# Patient Record
Sex: Female | Born: 1965 | Race: White | Hispanic: No | Marital: Married | Smoking: Never smoker
Health system: Southern US, Community
[De-identification: ages and names within clinical notes are randomized; demographics above are authoritative.]

---

## 1998-12-22 ENCOUNTER — Other Ambulatory Visit: Admission: RE | Admit: 1998-12-22 | Discharge: 1998-12-22 | Payer: Self-pay | Admitting: Obstetrics and Gynecology

## 1999-12-26 ENCOUNTER — Other Ambulatory Visit: Admission: RE | Admit: 1999-12-26 | Discharge: 1999-12-26 | Payer: Self-pay | Admitting: Obstetrics and Gynecology

## 2000-12-24 ENCOUNTER — Other Ambulatory Visit: Admission: RE | Admit: 2000-12-24 | Discharge: 2000-12-24 | Payer: Self-pay | Admitting: Obstetrics and Gynecology

## 2002-01-08 ENCOUNTER — Other Ambulatory Visit: Admission: RE | Admit: 2002-01-08 | Discharge: 2002-01-08 | Payer: Self-pay | Admitting: Obstetrics and Gynecology

## 2003-01-27 ENCOUNTER — Other Ambulatory Visit: Admission: RE | Admit: 2003-01-27 | Discharge: 2003-01-27 | Payer: Self-pay | Admitting: Obstetrics and Gynecology

## 2004-02-16 ENCOUNTER — Other Ambulatory Visit: Admission: RE | Admit: 2004-02-16 | Discharge: 2004-02-16 | Payer: Self-pay | Admitting: Obstetrics and Gynecology

## 2005-02-15 ENCOUNTER — Other Ambulatory Visit: Admission: RE | Admit: 2005-02-15 | Discharge: 2005-02-15 | Payer: Self-pay | Admitting: Obstetrics and Gynecology

## 2006-02-27 ENCOUNTER — Other Ambulatory Visit: Admission: RE | Admit: 2006-02-27 | Discharge: 2006-02-27 | Payer: Self-pay | Admitting: Obstetrics and Gynecology

## 2008-02-27 ENCOUNTER — Encounter: Admission: RE | Admit: 2008-02-27 | Discharge: 2008-02-27 | Payer: Self-pay | Admitting: Obstetrics and Gynecology

## 2008-08-28 ENCOUNTER — Encounter: Admission: RE | Admit: 2008-08-28 | Discharge: 2008-08-28 | Payer: Self-pay | Admitting: Obstetrics and Gynecology

## 2014-02-20 ENCOUNTER — Encounter (HOSPITAL_COMMUNITY): Payer: Self-pay | Admitting: Emergency Medicine

## 2014-02-20 ENCOUNTER — Emergency Department (HOSPITAL_COMMUNITY)
Admission: EM | Admit: 2014-02-20 | Discharge: 2014-02-20 | Disposition: A | Discharge status: Home / Self Care | Payer: BC Managed Care – PPO | Attending: Emergency Medicine | Admitting: Emergency Medicine

## 2014-02-20 DIAGNOSIS — S0083XA Contusion of other part of head, initial encounter: Secondary | ICD-10-CM

## 2014-02-20 DIAGNOSIS — Y92009 Unspecified place in unspecified non-institutional (private) residence as the place of occurrence of the external cause: Secondary | ICD-10-CM | POA: Insufficient documentation

## 2014-02-20 DIAGNOSIS — S0191XA Laceration without foreign body of unspecified part of head, initial encounter: Secondary | ICD-10-CM

## 2014-02-20 DIAGNOSIS — W010XXA Fall on same level from slipping, tripping and stumbling without subsequent striking against object, initial encounter: Secondary | ICD-10-CM | POA: Insufficient documentation

## 2014-02-20 DIAGNOSIS — S0190XA Unspecified open wound of unspecified part of head, initial encounter: Secondary | ICD-10-CM | POA: Insufficient documentation

## 2014-02-20 DIAGNOSIS — Y9301 Activity, walking, marching and hiking: Secondary | ICD-10-CM | POA: Insufficient documentation

## 2014-02-20 DIAGNOSIS — S1093XA Contusion of unspecified part of neck, initial encounter: Secondary | ICD-10-CM

## 2014-02-20 DIAGNOSIS — S0003XA Contusion of scalp, initial encounter: Secondary | ICD-10-CM | POA: Insufficient documentation

## 2014-02-20 MED ORDER — HYDROCODONE-ACETAMINOPHEN 5-325 MG PO TABS: 1.0000 | ORAL_TABLET | ORAL | Status: AC | PRN

## 2014-02-20 NOTE — ED Notes (Signed)
Wound closed by Tiffany, PA with staples. Bleeding controlled

## 2014-02-20 NOTE — ED Notes (Signed)
Pt from home c/o slipping on ice, falling and hitting the back of her head on the side of house. Pt denies LOC, N/V, visual changes, dizziness. Pt also adds that she hit L elbow soreness. Pt denies neck or back pain. Pt is A&O and in NAD

## 2014-02-20 NOTE — Discharge Instructions (Signed)
Head Injury, Adult °You have received a head injury. It does not appear serious at this time. Headaches and vomiting are common following head injury. It should be easy to awaken from sleeping. Sometimes it is necessary for you to stay in the emergency department for a while for observation. Sometimes admission to the hospital may be needed. After injuries such as yours, most problems occur within the first 24 hours, but side effects may occur up to 7 10 days after the injury. It is important for you to carefully monitor your condition and contact your health care provider or seek immediate medical care if there is a change in your condition. °WHAT ARE THE TYPES OF HEAD INJURIES? °Head injuries can be as minor as a bump. Some head injuries can be more severe. More severe head injuries include: °· A jarring injury to the brain (concussion). °· A bruise of the brain (contusion). This mean there is bleeding in the brain that can cause swelling. °· A cracked skull (skull fracture). °· Bleeding in the brain that collects, clots, and forms a bump (hematoma). °WHAT CAUSES A HEAD INJURY? °A serious head injury is most likely to happen to someone who is in a car wreck and is not wearing a seat belt. Other causes of major head injuries include bicycle or motorcycle accidents, sports injuries, and falls. °HOW ARE HEAD INJURIES DIAGNOSED? °A complete history of the event leading to the injury and your current symptoms will be helpful in diagnosing head injuries. Many times, pictures of the brain, such as CT or MRI are needed to see the extent of the injury. Often, an overnight hospital stay is necessary for observation.  °WHEN SHOULD I SEEK IMMEDIATE MEDICAL CARE?  °You should get help right away if: °· You have confusion or drowsiness. °· You feel sick to your stomach (nauseous) or have continued, forceful vomiting. °· You have dizziness or unsteadiness that is getting worse. °· You have severe, continued headaches not  relieved by medicine. Only take over-the-counter or prescription medicines for pain, fever, or discomfort as directed by your health care provider. °· You do not have normal function of the arms or legs or are unable to walk. °· You notice changes in the black spots in the center of the colored part of your eye (pupil). °· You have a clear or bloody fluid coming from your nose or ears. °· You have a loss of vision. °During the next 24 hours after the injury, you must stay with someone who can watch you for the warning signs. This person should contact local emergency services (911 in the [place] if you have seizures, you become unconscious, or you are unable to wake up. °HOW CAN I PREVENT A HEAD INJURY IN THE FUTURE? °The most important factor for preventing major head injuries is avoiding motor vehicle accidents.  To minimize the potential for damage to your head, it is crucial to wear seat belts while riding in motor vehicles. Wearing helmets while bike riding and playing collision sports (like football) is also helpful. Also, avoiding dangerous activities around the house will further help reduce your risk of head injury.  °WHEN CAN I RETURN TO NORMAL ACTIVITIES AND ATHLETICS? °You should be reevaluated by your health care provider before returning to these activities. If you have any of the following symptoms, you should not return to activities or contact sports until 1 week after the symptoms have stopped: °· Persistent headache. °· Dizziness or vertigo. °· Poor attention and concentration. °·   Confusion. °· Memory problems. °· Nausea or vomiting. °· Fatigue or tire easily. °· Irritability. °· Intolerant of bright lights or loud noises. °· Anxiety or depression. °· Disturbed sleep. °MAKE SURE YOU:  °· Understand these instructions. °· Will watch your condition. °· Will get help right away if you are not doing well or get worse. °Document Released: 12/11/2005 Document Revised: 10/01/2013 Document Reviewed:  08/18/2013 °[employer]® Patient Information ©2014 [employer]. ° °Staple Wound Closure °Staples are used to help a wound heal faster by holding the edges of the wound together. °HOME CARE °· Keep the area around the staples clean and dry. °· Rest and raise (elevate) the injured part above the level of your heart. °· See your doctor for a follow-up check of the wound. °· See your doctor to have the staples removed. °· Clean the wound daily with water. °· Do not soak the wound in water for long periods of time. °· Let air reach the wound as it heals. °GET HELP RIGHT AWAY IF:  °· You have redness or puffiness around the wound. °· You have a red line going away from the wound. °· You have more pain or tenderness. °· You have yellowish-white fluid (pus) coming from the wound. °· Your wound does not stay together after the staples have been taken out. °· You see something coming out of the wound, such as wood or glass. °· You have problems moving the injured area. °· You have a fever or lasting symptoms for more than 2-3 days. °· You have a fever and your symptoms suddenly get worse. °MAKE SURE YOU:  °· Understand these instructions. °· Will watch this condition. °· Will get help right away if you are not doing well or get worse. °Document Released: 09/19/2008 Document Revised: 09/04/2012 Document Reviewed: 06/23/2012 °[employer]® Patient Information ©2014 [employer]. ° °

## 2014-02-20 NOTE — ED Provider Notes (Signed)
CSN: [id number]     Arrival date & time 02/20/14  4098 History   First MD Initiated Contact with Patient 02/20/14 0848     Chief Complaint  Patient presents with  . Fall  . Head Laceration     (Consider location/radiation/quality/duration/timing/severity/associated sxs/prior Treatment) HPI  Patient to the ER with complaints of fall and head lacerations. She was walking in her driveway when she slipped and hit head on the edge of her house. She reports being stunned right away but then got up, went into the house and asked her daughter to bring her to the hospital. She reports not feeling dizzy, nauseous, no headache, no confusion. States she feels fine other than the cut that hurts. She denies neck pain. Denies loc. Large head laceration to posterior scalp. She reports being UTD on tetanus  History reviewed. No pertinent past medical history. History reviewed. No pertinent past surgical history. No family history on file. History  Substance Use Topics  . Smoking status: Never Smoker   . Smokeless tobacco: Not on file  . Alcohol Use: No   OB History   Grav Para Term Preterm Abortions TAB SAB Ect Mult Living                 Review of Systems  The patient denies anorexia, fever, weight loss,, vision loss, decreased hearing, hoarseness, chest pain, syncope, dyspnea on exertion, peripheral edema, balance deficits, hemoptysis, abdominal pain, melena, hematochezia, severe indigestion/heartburn, hematuria, incontinence, genital sores, muscle weakness, suspicious skin lesions, transient blindness, difficulty walking, depression, unusual weight change,, enlarged lymph nodes, angioedema, and breast masses.   Allergies  Review of patient's allergies indicates not on file.  Home Medications   Current Outpatient Rx  Name  Route  Sig  Dispense  Refill  . HYDROcodone-acetaminophen (NORCO/VICODIN) 5-325 MG per tablet   Oral   Take 1-2 tablets by mouth every 4 (four) hours as needed.   20  tablet   0    BP 142/77  Pulse 97  Temp(Src) 97.8 F (36.6 C) (Oral)  Resp 16  SpO2 100%  LMP 02/06/2014 Physical Exam  Nursing note and vitals reviewed. Constitutional: She is oriented to person, place, and time. She appears well-developed and well-nourished. No distress.  HENT:  Head: Normocephalic. Head is with contusion and with laceration. Head is without raccoon's eyes, without abrasion, without right periorbital erythema and without left periorbital erythema.    Eyes: Pupils are equal, round, and reactive to light.  Neck: Normal range of motion. Neck supple.  Cardiovascular: Normal rate and regular rhythm.   Pulmonary/Chest: Effort normal.  Abdominal: Soft.  Neurological: She is alert and oriented to person, place, and time. She has normal strength. No cranial nerve deficit or sensory deficit. She displays a negative Romberg sign. GCS eye subscore is 4. GCS verbal subscore is 5. GCS motor subscore is 6.  Skin: Skin is warm and dry.    ED Course  Procedures (including critical care time) Labs Review Labs Reviewed - No data to display Imaging Review No results found.  EKG Interpretation  None  MDM   Final diagnoses:  Laceration of head    Thorough neuro exam done without deficits. Discussed head ct vs no head ct with patient. Based on physical exam and no loc, i do not feel she needs a head CT. Patient agrees. Strict return to ED precautions given on what would warrant a return visit and need for head CT.  LACERATION REPAIR Performed by: Dorthula Matas  Authorized by: Dorthula MatasGREENE,Mihaela Fajardo G Consent: Verbal consent obtained. Risks and benefits: risks, benefits and alternatives were discussed Consent given by: patient Patient identity confirmed: provided demographic data Prepped and Draped in normal sterile fashion Wound explored  Laceration Location: posterior scalp  Laceration Length: 8 cm  No Foreign Bodies seen or palpated  Anesthesia: local  infiltration  Local anesthetic: lidocaine 2 % with  epinephrine  Anesthetic total: 6 ml  Irrigation method: syringe Amount of cleaning: standard  Skin closure: staples  Number of staples: 5  Technique: staples  Patient tolerance: Patient tolerated the procedure well with no immediate complications.   48 y.o.Lupita LeashDonna Simoneaux's evaluation in the Emergency Department is complete. It has been determined that no acute conditions requiring further emergency intervention are present at this time. The patient/guardian have been advised of the diagnosis and plan. We have discussed signs and symptoms that warrant return to the ED, such as changes or worsening in symptoms.  Vital signs are stable at discharge. Filed Vitals:   02/20/14 0900  BP: 142/77  Pulse: 97  Temp: 97.8 F (36.6 C)  Resp: 16    Patient/guardian has voiced understanding and agreed to follow-up with the PCP or specialist.   Dorthula Matasiffany G Zyon Rosser, PA-C 02/20/14 0945

## 2014-02-20 NOTE — ED Provider Notes (Signed)
Medical screening examination/treatment/procedure(s) were performed by non-physician practitioner and as supervising physician I was immediately available for consultation/collaboration.  EKG Interpretation  None    Deanglo Hissong, MD 02/20/14 1553 

## 2014-02-20 NOTE — ED Notes (Addendum)
Per pt, fell on ice this am-laceration to back of head-left elbow pain-no deformity-no LOC

## 2015-09-13 ENCOUNTER — Other Ambulatory Visit: Payer: Self-pay | Admitting: Obstetrics and Gynecology

## 2015-09-13 DIAGNOSIS — R928 Other abnormal and inconclusive findings on diagnostic imaging of breast: Secondary | ICD-10-CM

## 2015-09-17 ENCOUNTER — Ambulatory Visit
Admission: RE | Admit: 2015-09-17 | Discharge: 2015-09-17 | Disposition: A | Discharge status: Home / Self Care | Payer: BLUE CROSS/BLUE SHIELD | Source: Ambulatory Visit | Attending: Obstetrics and Gynecology | Admitting: Obstetrics and Gynecology

## 2015-09-17 DIAGNOSIS — R928 Other abnormal and inconclusive findings on diagnostic imaging of breast: Secondary | ICD-10-CM

## 2016-04-25 DIAGNOSIS — R109 Unspecified abdominal pain: Secondary | ICD-10-CM | POA: Diagnosis not present

## 2016-05-10 DIAGNOSIS — R748 Abnormal levels of other serum enzymes: Secondary | ICD-10-CM | POA: Diagnosis not present

## 2016-05-29 DIAGNOSIS — H5213 Myopia, bilateral: Secondary | ICD-10-CM | POA: Diagnosis not present

## 2016-07-05 IMAGING — MG MM DIAG BREAST TOMO UNI RIGHT
6 series · 6 of 18 positions shown · non-contrast
Comparison: Previous exam(s).

CLINICAL DATA: Further evaluation of possible posterior superior
right breast mass

EXAM:
DIGITAL DIAGNOSTIC RIGHT MAMMOGRAM WITH 3D TOMOSYNTHESIS WITH CAD
ULTRASOUND RIGHT BREAST

[R ML]
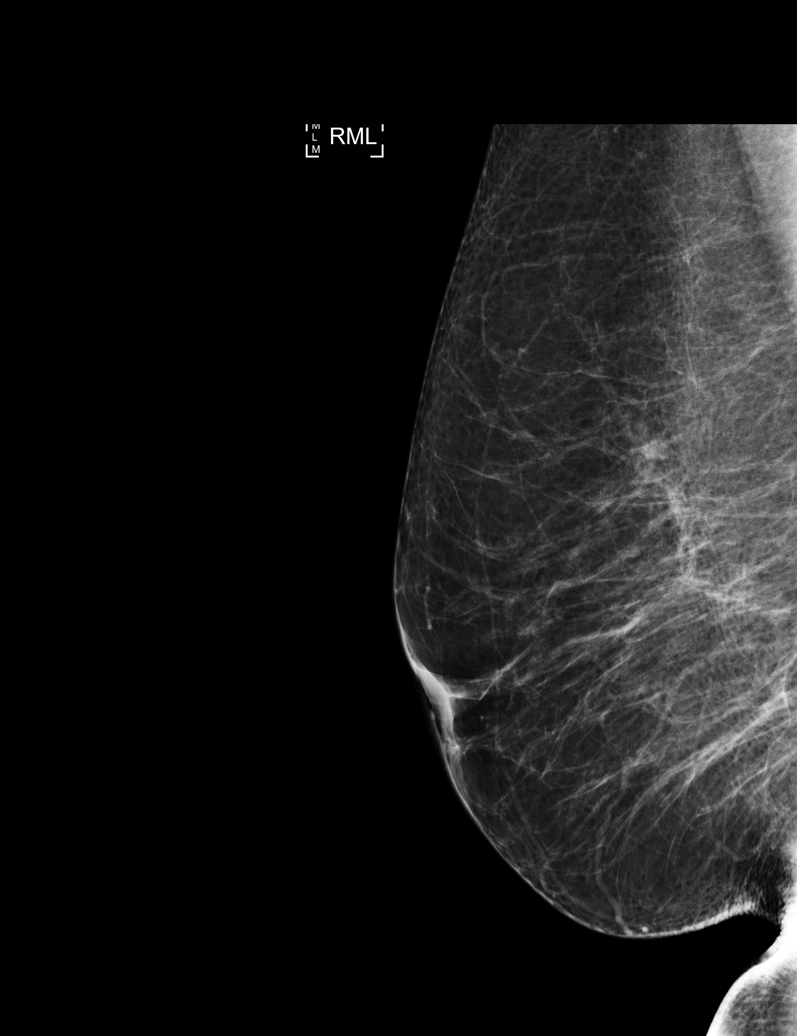

[R MLO]
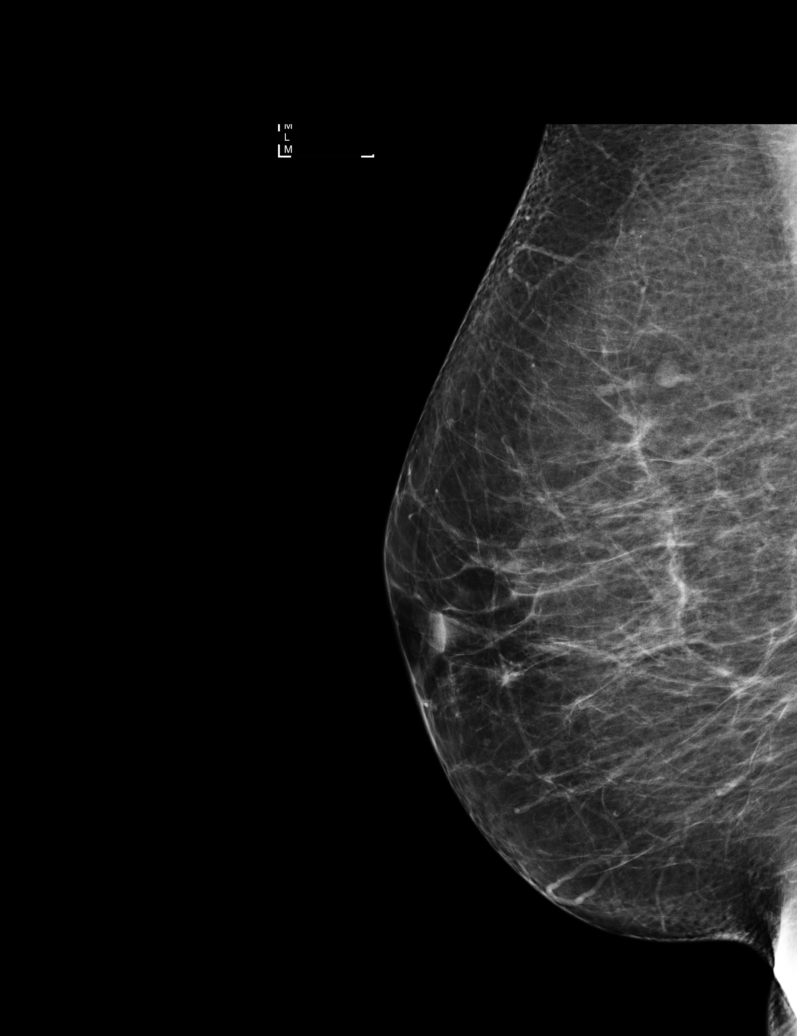

[R XCCL]
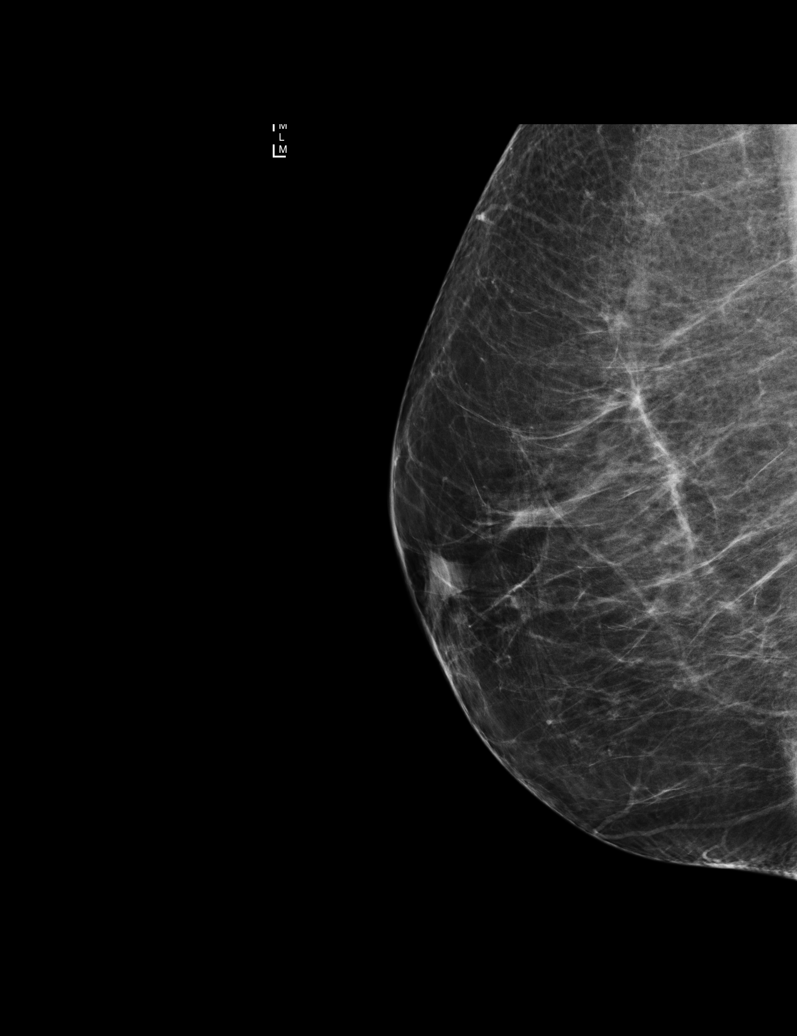

[R MLO tomo · tomo slice 35/69.0]
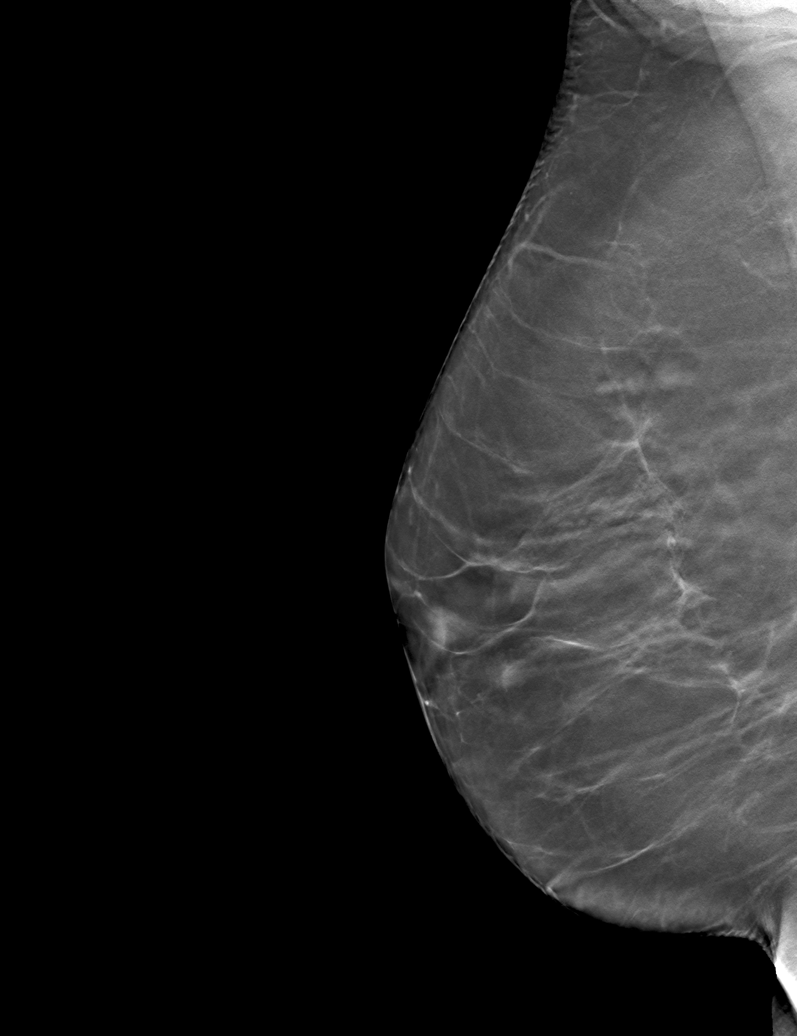

[R ML tomo · tomo slice 40/79.0]
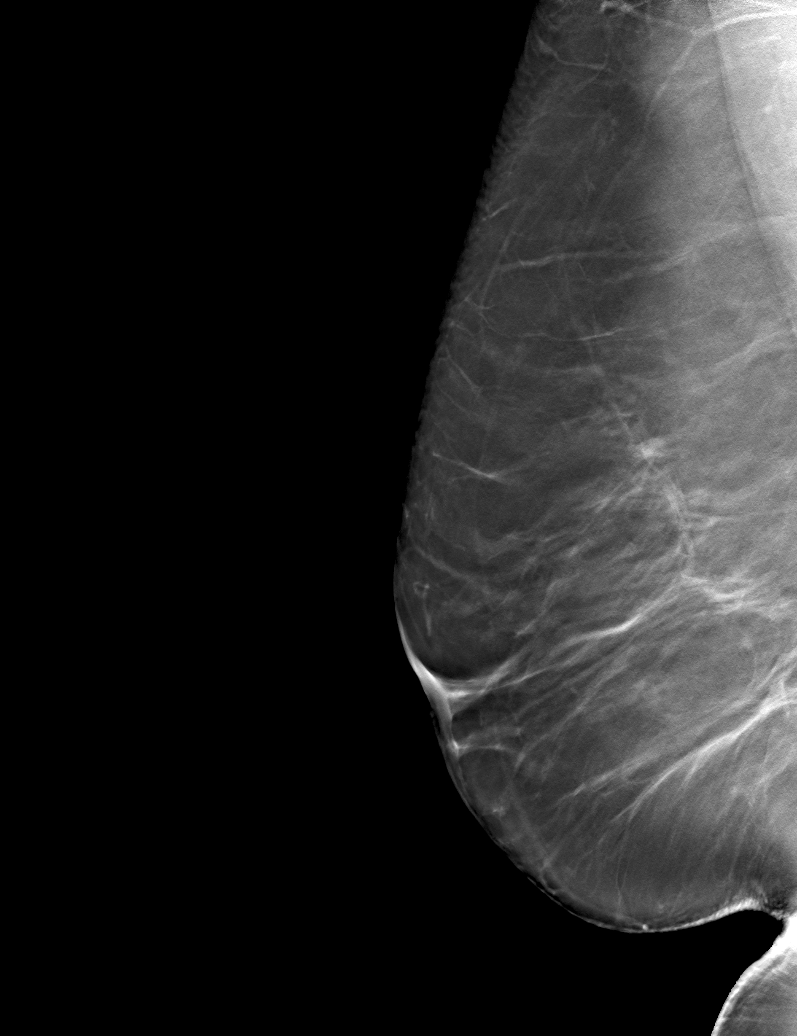

[R XCCL tomo · tomo slice 39/76.0]
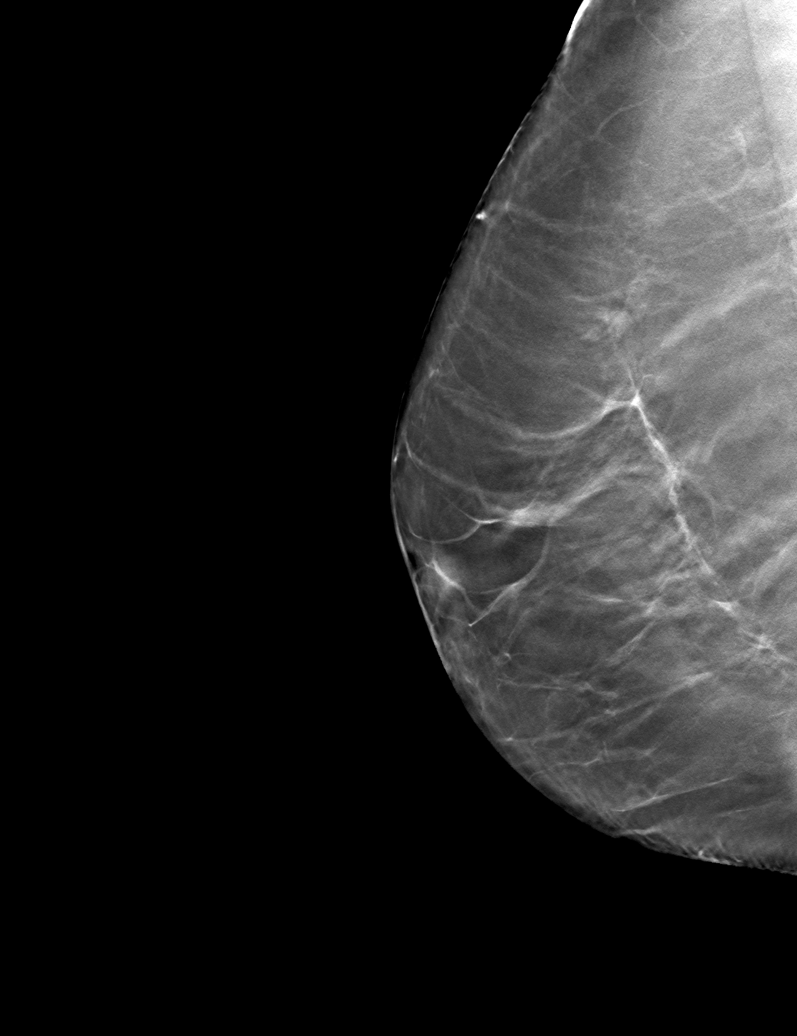

[6 of 18 positions shown; findings below may reference images not displayed]

ACR Breast Density Category b: There are scattered areas of
fibroglandular density.
FINDINGS: There is a persistent oval circumscribed 8 mm mass in the far
posterior right upper outer quadrant. It shows relatively
low-attenuation.

Mammographic images were processed with CAD.

On physical exam, there are no palpable abnormalities.

Targeted ultrasound is performed, showing an oval circumscribed
hypoechoic mass 7 cm from the nipple in the 10 o'clock position of
the right breast. It shows increased sound transmission and parallel
orientation. It demonstrates a prominent echogenic central area with
a few echogenic septations. It demonstrates no internal blood flow.
The mass measures 4 x 3 x 6 mm.
IMPRESSION: Right breast mass likely represents a benign intramammary lymph
node.

RECOMMENDATION:
Diagnostic right mammogram and ultrasound in 6 months

I have discussed the findings and recommendations with the patient.
Results were also provided in writing at the conclusion of the
visit. If applicable, a reminder letter will be sent to the patient
regarding the next appointment.

BI-RADS CATEGORY  3: Probably benign.

## 2016-08-15 DIAGNOSIS — Z0000 Encounter for general adult medical examination without abnormal findings: Secondary | ICD-10-CM | POA: Diagnosis not present

## 2016-08-21 ENCOUNTER — Other Ambulatory Visit: Payer: Self-pay | Admitting: Obstetrics and Gynecology

## 2016-08-21 DIAGNOSIS — N6310 Unspecified lump in the right breast, unspecified quadrant: Secondary | ICD-10-CM

## 2016-09-11 ENCOUNTER — Other Ambulatory Visit: Payer: BLUE CROSS/BLUE SHIELD

## 2016-09-11 ENCOUNTER — Inpatient Hospital Stay: Admission: RE | Admit: 2016-09-11 | Payer: BLUE CROSS/BLUE SHIELD | Source: Ambulatory Visit

## 2016-09-12 ENCOUNTER — Ambulatory Visit
Admission: RE | Admit: 2016-09-12 | Discharge: 2016-09-12 | Disposition: A | Discharge status: Home / Self Care | Payer: BLUE CROSS/BLUE SHIELD | Source: Ambulatory Visit | Attending: Obstetrics and Gynecology | Admitting: Obstetrics and Gynecology

## 2016-09-12 DIAGNOSIS — N6310 Unspecified lump in the right breast, unspecified quadrant: Secondary | ICD-10-CM

## 2016-09-12 DIAGNOSIS — Z6831 Body mass index (BMI) 31.0-31.9, adult: Secondary | ICD-10-CM | POA: Diagnosis not present

## 2016-09-12 DIAGNOSIS — Z01419 Encounter for gynecological examination (general) (routine) without abnormal findings: Secondary | ICD-10-CM | POA: Diagnosis not present

## 2016-09-12 DIAGNOSIS — N63 Unspecified lump in breast: Secondary | ICD-10-CM | POA: Diagnosis not present

## 2017-06-04 DIAGNOSIS — H524 Presbyopia: Secondary | ICD-10-CM | POA: Diagnosis not present

## 2017-08-17 DIAGNOSIS — R945 Abnormal results of liver function studies: Secondary | ICD-10-CM | POA: Diagnosis not present

## 2017-08-17 DIAGNOSIS — Z0000 Encounter for general adult medical examination without abnormal findings: Secondary | ICD-10-CM | POA: Diagnosis not present

## 2017-11-07 DIAGNOSIS — Z01419 Encounter for gynecological examination (general) (routine) without abnormal findings: Secondary | ICD-10-CM | POA: Diagnosis not present

## 2017-11-07 DIAGNOSIS — Z6825 Body mass index (BMI) 25.0-25.9, adult: Secondary | ICD-10-CM | POA: Diagnosis not present

## 2017-11-07 DIAGNOSIS — Z1231 Encounter for screening mammogram for malignant neoplasm of breast: Secondary | ICD-10-CM | POA: Diagnosis not present

## 2018-09-20 DIAGNOSIS — Z0000 Encounter for general adult medical examination without abnormal findings: Secondary | ICD-10-CM | POA: Diagnosis not present

## 2018-09-20 DIAGNOSIS — R0981 Nasal congestion: Secondary | ICD-10-CM | POA: Diagnosis not present

## 2018-11-06 DIAGNOSIS — Z01419 Encounter for gynecological examination (general) (routine) without abnormal findings: Secondary | ICD-10-CM | POA: Diagnosis not present

## 2018-11-08 DIAGNOSIS — Z6829 Body mass index (BMI) 29.0-29.9, adult: Secondary | ICD-10-CM | POA: Diagnosis not present

## 2018-11-08 DIAGNOSIS — Z1231 Encounter for screening mammogram for malignant neoplasm of breast: Secondary | ICD-10-CM | POA: Diagnosis not present

## 2018-11-08 DIAGNOSIS — Z01419 Encounter for gynecological examination (general) (routine) without abnormal findings: Secondary | ICD-10-CM | POA: Diagnosis not present

## 2018-11-27 DIAGNOSIS — H5213 Myopia, bilateral: Secondary | ICD-10-CM | POA: Diagnosis not present

## 2019-11-05 DIAGNOSIS — Z0000 Encounter for general adult medical examination without abnormal findings: Secondary | ICD-10-CM | POA: Diagnosis not present

## 2019-11-19 DIAGNOSIS — Z1231 Encounter for screening mammogram for malignant neoplasm of breast: Secondary | ICD-10-CM | POA: Diagnosis not present

## 2019-11-19 DIAGNOSIS — Z01419 Encounter for gynecological examination (general) (routine) without abnormal findings: Secondary | ICD-10-CM | POA: Diagnosis not present

## 2019-11-19 DIAGNOSIS — Z6831 Body mass index (BMI) 31.0-31.9, adult: Secondary | ICD-10-CM | POA: Diagnosis not present

## 2020-02-16 DIAGNOSIS — H5213 Myopia, bilateral: Secondary | ICD-10-CM | POA: Diagnosis not present

## 2020-02-24 DIAGNOSIS — Z23 Encounter for immunization: Secondary | ICD-10-CM | POA: Diagnosis not present

## 2020-03-23 DIAGNOSIS — Z23 Encounter for immunization: Secondary | ICD-10-CM | POA: Diagnosis not present

## 2020-08-31 DIAGNOSIS — L814 Other melanin hyperpigmentation: Secondary | ICD-10-CM | POA: Diagnosis not present

## 2020-08-31 DIAGNOSIS — L820 Inflamed seborrheic keratosis: Secondary | ICD-10-CM | POA: Diagnosis not present

## 2020-08-31 DIAGNOSIS — L578 Other skin changes due to chronic exposure to nonionizing radiation: Secondary | ICD-10-CM | POA: Diagnosis not present

## 2020-09-28 DIAGNOSIS — D485 Neoplasm of uncertain behavior of skin: Secondary | ICD-10-CM | POA: Diagnosis not present

## 2020-09-28 DIAGNOSIS — L814 Other melanin hyperpigmentation: Secondary | ICD-10-CM | POA: Diagnosis not present

## 2020-09-28 DIAGNOSIS — D1801 Hemangioma of skin and subcutaneous tissue: Secondary | ICD-10-CM | POA: Diagnosis not present

## 2020-09-28 DIAGNOSIS — L430 Hypertrophic lichen planus: Secondary | ICD-10-CM | POA: Diagnosis not present

## 2020-09-28 DIAGNOSIS — B009 Herpesviral infection, unspecified: Secondary | ICD-10-CM | POA: Diagnosis not present

## 2020-09-28 DIAGNOSIS — L578 Other skin changes due to chronic exposure to nonionizing radiation: Secondary | ICD-10-CM | POA: Diagnosis not present

## 2020-11-05 DIAGNOSIS — Z13220 Encounter for screening for lipoid disorders: Secondary | ICD-10-CM | POA: Diagnosis not present

## 2020-11-05 DIAGNOSIS — Z0000 Encounter for general adult medical examination without abnormal findings: Secondary | ICD-10-CM | POA: Diagnosis not present

## 2020-11-05 DIAGNOSIS — Z23 Encounter for immunization: Secondary | ICD-10-CM | POA: Diagnosis not present

## 2020-12-22 DIAGNOSIS — Z01419 Encounter for gynecological examination (general) (routine) without abnormal findings: Secondary | ICD-10-CM | POA: Diagnosis not present

## 2020-12-22 DIAGNOSIS — Z1231 Encounter for screening mammogram for malignant neoplasm of breast: Secondary | ICD-10-CM | POA: Diagnosis not present

## 2020-12-22 DIAGNOSIS — Z6832 Body mass index (BMI) 32.0-32.9, adult: Secondary | ICD-10-CM | POA: Diagnosis not present

## 2021-04-25 DIAGNOSIS — H5213 Myopia, bilateral: Secondary | ICD-10-CM | POA: Diagnosis not present

## 2022-01-24 DIAGNOSIS — Z6833 Body mass index (BMI) 33.0-33.9, adult: Secondary | ICD-10-CM | POA: Diagnosis not present

## 2022-01-24 DIAGNOSIS — N952 Postmenopausal atrophic vaginitis: Secondary | ICD-10-CM | POA: Diagnosis not present

## 2022-01-24 DIAGNOSIS — N819 Female genital prolapse, unspecified: Secondary | ICD-10-CM | POA: Diagnosis not present

## 2022-01-24 DIAGNOSIS — Z13820 Encounter for screening for osteoporosis: Secondary | ICD-10-CM | POA: Diagnosis not present

## 2022-01-24 DIAGNOSIS — Z01419 Encounter for gynecological examination (general) (routine) without abnormal findings: Secondary | ICD-10-CM | POA: Diagnosis not present

## 2022-01-24 DIAGNOSIS — Z1231 Encounter for screening mammogram for malignant neoplasm of breast: Secondary | ICD-10-CM | POA: Diagnosis not present

## 2022-02-02 DIAGNOSIS — M810 Age-related osteoporosis without current pathological fracture: Secondary | ICD-10-CM | POA: Diagnosis not present

## 2022-02-02 DIAGNOSIS — Z13220 Encounter for screening for lipoid disorders: Secondary | ICD-10-CM | POA: Diagnosis not present

## 2022-02-02 DIAGNOSIS — E215 Disorder of parathyroid gland, unspecified: Secondary | ICD-10-CM | POA: Diagnosis not present

## 2022-02-02 DIAGNOSIS — Z0000 Encounter for general adult medical examination without abnormal findings: Secondary | ICD-10-CM | POA: Diagnosis not present

## 2022-04-25 DIAGNOSIS — H5213 Myopia, bilateral: Secondary | ICD-10-CM | POA: Diagnosis not present

## 2022-04-25 DIAGNOSIS — H2513 Age-related nuclear cataract, bilateral: Secondary | ICD-10-CM | POA: Diagnosis not present

## 2022-04-27 DIAGNOSIS — M810 Age-related osteoporosis without current pathological fracture: Secondary | ICD-10-CM | POA: Diagnosis not present

## 2022-04-27 DIAGNOSIS — E7800 Pure hypercholesterolemia, unspecified: Secondary | ICD-10-CM | POA: Diagnosis not present

## 2022-08-03 DIAGNOSIS — Z5181 Encounter for therapeutic drug level monitoring: Secondary | ICD-10-CM | POA: Diagnosis not present

## 2022-08-03 DIAGNOSIS — E7800 Pure hypercholesterolemia, unspecified: Secondary | ICD-10-CM | POA: Diagnosis not present

## 2023-02-08 DIAGNOSIS — Z1231 Encounter for screening mammogram for malignant neoplasm of breast: Secondary | ICD-10-CM | POA: Diagnosis not present

## 2023-02-08 DIAGNOSIS — Z01419 Encounter for gynecological examination (general) (routine) without abnormal findings: Secondary | ICD-10-CM | POA: Diagnosis not present

## 2023-02-08 DIAGNOSIS — Z6833 Body mass index (BMI) 33.0-33.9, adult: Secondary | ICD-10-CM | POA: Diagnosis not present

## 2023-05-01 DIAGNOSIS — H5213 Myopia, bilateral: Secondary | ICD-10-CM | POA: Diagnosis not present

## 2023-11-06 DIAGNOSIS — L578 Other skin changes due to chronic exposure to nonionizing radiation: Secondary | ICD-10-CM | POA: Diagnosis not present

## 2023-11-06 DIAGNOSIS — D485 Neoplasm of uncertain behavior of skin: Secondary | ICD-10-CM | POA: Diagnosis not present

## 2023-11-06 DIAGNOSIS — L814 Other melanin hyperpigmentation: Secondary | ICD-10-CM | POA: Diagnosis not present

## 2024-01-10 DIAGNOSIS — L821 Other seborrheic keratosis: Secondary | ICD-10-CM | POA: Diagnosis not present

## 2024-01-10 DIAGNOSIS — D2239 Melanocytic nevi of other parts of face: Secondary | ICD-10-CM | POA: Diagnosis not present

## 2024-01-10 DIAGNOSIS — L814 Other melanin hyperpigmentation: Secondary | ICD-10-CM | POA: Diagnosis not present

## 2024-01-10 DIAGNOSIS — D225 Melanocytic nevi of trunk: Secondary | ICD-10-CM | POA: Diagnosis not present

## 2024-02-22 DIAGNOSIS — Z131 Encounter for screening for diabetes mellitus: Secondary | ICD-10-CM | POA: Diagnosis not present

## 2024-02-22 DIAGNOSIS — E538 Deficiency of other specified B group vitamins: Secondary | ICD-10-CM | POA: Diagnosis not present

## 2024-02-22 DIAGNOSIS — H9319 Tinnitus, unspecified ear: Secondary | ICD-10-CM | POA: Diagnosis not present

## 2024-02-22 DIAGNOSIS — M810 Age-related osteoporosis without current pathological fracture: Secondary | ICD-10-CM | POA: Diagnosis not present

## 2024-02-22 DIAGNOSIS — Z0000 Encounter for general adult medical examination without abnormal findings: Secondary | ICD-10-CM | POA: Diagnosis not present

## 2024-02-22 DIAGNOSIS — E782 Mixed hyperlipidemia: Secondary | ICD-10-CM | POA: Diagnosis not present

## 2024-02-22 DIAGNOSIS — E559 Vitamin D deficiency, unspecified: Secondary | ICD-10-CM | POA: Diagnosis not present

## 2024-03-06 DIAGNOSIS — Z6833 Body mass index (BMI) 33.0-33.9, adult: Secondary | ICD-10-CM | POA: Diagnosis not present

## 2024-03-06 DIAGNOSIS — Z1231 Encounter for screening mammogram for malignant neoplasm of breast: Secondary | ICD-10-CM | POA: Diagnosis not present

## 2024-03-06 DIAGNOSIS — Z13820 Encounter for screening for osteoporosis: Secondary | ICD-10-CM | POA: Diagnosis not present

## 2024-03-06 DIAGNOSIS — Z01419 Encounter for gynecological examination (general) (routine) without abnormal findings: Secondary | ICD-10-CM | POA: Diagnosis not present

## 2024-08-14 DIAGNOSIS — H43811 Vitreous degeneration, right eye: Secondary | ICD-10-CM | POA: Diagnosis not present

## 2024-09-09 DIAGNOSIS — H43811 Vitreous degeneration, right eye: Secondary | ICD-10-CM | POA: Diagnosis not present
